# Patient Record
Sex: Male | Born: 1955 | Race: White | Hispanic: No | Smoking: Former smoker
Health system: Southern US, Community
[De-identification: ages and names within clinical notes are randomized; demographics above are authoritative.]

## PROBLEM LIST (undated history)

## (undated) DIAGNOSIS — R0789 Other chest pain: Secondary | ICD-10-CM

## (undated) DIAGNOSIS — C7931 Secondary malignant neoplasm of brain: Secondary | ICD-10-CM

## (undated) DIAGNOSIS — I10 Essential (primary) hypertension: Secondary | ICD-10-CM

## (undated) DIAGNOSIS — E559 Vitamin D deficiency, unspecified: Secondary | ICD-10-CM

## (undated) DIAGNOSIS — C3412 Malignant neoplasm of upper lobe, left bronchus or lung: Secondary | ICD-10-CM

## (undated) DIAGNOSIS — C7951 Secondary malignant neoplasm of bone: Secondary | ICD-10-CM

## (undated) DIAGNOSIS — R0602 Shortness of breath: Secondary | ICD-10-CM

## (undated) DIAGNOSIS — R5383 Other fatigue: Secondary | ICD-10-CM

## (undated) DIAGNOSIS — R9439 Abnormal result of other cardiovascular function study: Secondary | ICD-10-CM

## (undated) DIAGNOSIS — N400 Enlarged prostate without lower urinary tract symptoms: Secondary | ICD-10-CM

## (undated) DIAGNOSIS — E785 Hyperlipidemia, unspecified: Secondary | ICD-10-CM

## (undated) HISTORY — DX: Essential (primary) hypertension: I10

## (undated) HISTORY — DX: Hyperlipidemia, unspecified: E78.5

## (undated) HISTORY — DX: Secondary malignant neoplasm of bone: C79.51

## (undated) HISTORY — PX: KNEE SURGERY: SHX244

## (undated) HISTORY — DX: Secondary malignant neoplasm of brain: C79.31

## (undated) HISTORY — DX: Malignant neoplasm of upper lobe, left bronchus or lung: C34.12

## (undated) HISTORY — PX: HERNIA REPAIR: SHX51

## (undated) HISTORY — DX: Other chest pain: R07.89

## (undated) HISTORY — DX: Benign prostatic hyperplasia without lower urinary tract symptoms: N40.0

## (undated) HISTORY — DX: Vitamin D deficiency, unspecified: E55.9

## (undated) HISTORY — PX: TUMOR REMOVAL: SHX12

## (undated) HISTORY — DX: Other fatigue: R53.83

## (undated) HISTORY — DX: Abnormal result of other cardiovascular function study: R94.39

## (undated) HISTORY — DX: Shortness of breath: R06.02

---

## 1998-06-26 ENCOUNTER — Emergency Department (HOSPITAL_COMMUNITY): Admission: EM | Admit: 1998-06-26 | Discharge: 1998-06-26 | Payer: Self-pay | Admitting: *Deleted

## 1998-11-26 ENCOUNTER — Other Ambulatory Visit: Admission: RE | Admit: 1998-11-26 | Discharge: 1998-11-26 | Payer: Self-pay | Admitting: Gastroenterology

## 2016-01-10 ENCOUNTER — Other Ambulatory Visit: Payer: Self-pay

## 2016-12-25 DIAGNOSIS — E785 Hyperlipidemia, unspecified: Secondary | ICD-10-CM | POA: Diagnosis not present

## 2016-12-25 DIAGNOSIS — Z0000 Encounter for general adult medical examination without abnormal findings: Secondary | ICD-10-CM | POA: Diagnosis not present

## 2016-12-25 DIAGNOSIS — Z1389 Encounter for screening for other disorder: Secondary | ICD-10-CM | POA: Diagnosis not present

## 2016-12-25 DIAGNOSIS — R7309 Other abnormal glucose: Secondary | ICD-10-CM | POA: Diagnosis not present

## 2017-08-15 DIAGNOSIS — E785 Hyperlipidemia, unspecified: Secondary | ICD-10-CM | POA: Diagnosis not present

## 2017-08-15 DIAGNOSIS — E559 Vitamin D deficiency, unspecified: Secondary | ICD-10-CM | POA: Diagnosis not present

## 2017-08-15 DIAGNOSIS — R7309 Other abnormal glucose: Secondary | ICD-10-CM | POA: Diagnosis not present

## 2017-08-15 DIAGNOSIS — D649 Anemia, unspecified: Secondary | ICD-10-CM | POA: Diagnosis not present

## 2017-08-15 DIAGNOSIS — Z79899 Other long term (current) drug therapy: Secondary | ICD-10-CM | POA: Diagnosis not present

## 2017-08-15 DIAGNOSIS — R350 Frequency of micturition: Secondary | ICD-10-CM | POA: Diagnosis not present

## 2018-01-01 DIAGNOSIS — E559 Vitamin D deficiency, unspecified: Secondary | ICD-10-CM | POA: Diagnosis not present

## 2018-01-01 DIAGNOSIS — H6120 Impacted cerumen, unspecified ear: Secondary | ICD-10-CM | POA: Diagnosis not present

## 2018-01-01 DIAGNOSIS — R6889 Other general symptoms and signs: Secondary | ICD-10-CM | POA: Diagnosis not present

## 2018-01-01 DIAGNOSIS — E785 Hyperlipidemia, unspecified: Secondary | ICD-10-CM | POA: Diagnosis not present

## 2018-01-01 DIAGNOSIS — J029 Acute pharyngitis, unspecified: Secondary | ICD-10-CM | POA: Diagnosis not present

## 2018-01-01 DIAGNOSIS — Z0000 Encounter for general adult medical examination without abnormal findings: Secondary | ICD-10-CM | POA: Diagnosis not present

## 2018-08-21 DIAGNOSIS — R0789 Other chest pain: Secondary | ICD-10-CM | POA: Diagnosis not present

## 2018-08-21 DIAGNOSIS — D649 Anemia, unspecified: Secondary | ICD-10-CM | POA: Diagnosis not present

## 2018-08-21 DIAGNOSIS — E785 Hyperlipidemia, unspecified: Secondary | ICD-10-CM | POA: Diagnosis not present

## 2018-08-21 DIAGNOSIS — R7309 Other abnormal glucose: Secondary | ICD-10-CM | POA: Diagnosis not present

## 2018-08-22 DIAGNOSIS — R0789 Other chest pain: Secondary | ICD-10-CM | POA: Diagnosis not present

## 2018-09-09 DIAGNOSIS — R9439 Abnormal result of other cardiovascular function study: Secondary | ICD-10-CM

## 2018-09-09 DIAGNOSIS — R0789 Other chest pain: Secondary | ICD-10-CM

## 2018-09-09 DIAGNOSIS — I10 Essential (primary) hypertension: Secondary | ICD-10-CM

## 2018-09-09 DIAGNOSIS — E785 Hyperlipidemia, unspecified: Secondary | ICD-10-CM | POA: Insufficient documentation

## 2018-09-09 DIAGNOSIS — N400 Enlarged prostate without lower urinary tract symptoms: Secondary | ICD-10-CM

## 2018-09-09 DIAGNOSIS — R0602 Shortness of breath: Secondary | ICD-10-CM | POA: Insufficient documentation

## 2018-09-09 DIAGNOSIS — R5383 Other fatigue: Secondary | ICD-10-CM

## 2018-09-09 DIAGNOSIS — E559 Vitamin D deficiency, unspecified: Secondary | ICD-10-CM

## 2018-09-09 HISTORY — DX: Vitamin D deficiency, unspecified: E55.9

## 2018-09-09 HISTORY — DX: Other fatigue: R53.83

## 2018-09-09 HISTORY — DX: Other chest pain: R07.89

## 2018-09-09 HISTORY — DX: Benign prostatic hyperplasia without lower urinary tract symptoms: N40.0

## 2018-09-09 HISTORY — DX: Essential (primary) hypertension: I10

## 2018-09-09 HISTORY — DX: Hyperlipidemia, unspecified: E78.5

## 2018-09-09 HISTORY — DX: Shortness of breath: R06.02

## 2018-09-09 HISTORY — DX: Abnormal result of other cardiovascular function study: R94.39

## 2018-09-10 ENCOUNTER — Ambulatory Visit: Payer: Self-pay | Admitting: Cardiology

## 2018-09-25 NOTE — Progress Notes (Signed)
Cardiology Office Note:    Date:  09/26/2018   ID:  Andrew Bowen, DOB 10-Sep-1956, MRN [id number]  PCP:  Ocie Doyne., MD  Cardiologist:  Shirlee More, MD   Referring MD: No ref. provider found  ASSESSMENT:    1. Abnormal stress ECG with treadmill   2. Anginal equivalent ([place])   3. Type 2 diabetes mellitus without complication, without long-term current use of insulin ([place])   4. Pure hypercholesterolemia    PLAN:    In order of problems listed above:  1. He is in a high risk group with age type 2 diabetes hyperlipidemia as anginal equivalent symptom of exertional dyspnea and abnormal treadmill stress EKG test.  I reviewed with the patient that it does not give incremental prognostic information other than normal or abnormal in his case advised to undergo further evaluation after discussion of options of imaging modalities or cardiac CTA he elects to undergo cardiac CTA to be set up as outpatient and if he has severe flow-limiting stenosis would benefit from further evaluation and revascularization.  I will see him back in the office in 6 weeks to review the results 2. Typical symptoms limiting exertional dyspnea see above 3. Stable managed by his PCP A1c is above target if a second agent as needed GLP receptor antagonist or SGLT2 agents would be ideal 4. Continue with statin if he has CAD he will require more intensive therapy either adding Zetia or PCSK9  Next appointment 6 weeks   Medication Adjustments/Labs and Tests Ordered: Current medicines are reviewed at length with the patient today.  Concerns regarding medicines are outlined above.  Orders Placed This Encounter  Procedures  . CT CORONARY MORPH W/CTA COR W/SCORE W/CA W/CM &/OR WO/CM  . CT CORONARY FRACTIONAL FLOW RESERVE DATA PREP  . CT CORONARY FRACTIONAL FLOW RESERVE FLUID ANALYSIS  . EKG 12-Lead   No orders of the defined types were placed in this encounter.    Chief Complaint  Patient presents with  . Chest  Pain    abnormal stress test  . Diabetes Mellitus  . Hyperlipidemia  . Hypertension    History of Present Illness:    Andrew Bowen is a 62 y.o. male who is being seen today for the evaluation of exertional dyspnea and abnormal treadmill stress EKG test recently at [facility] at the request of Dr Micheal Likens.  In a years ago I cared for his ex-wife.  He always considers himself an active athletic man tells me he scored 4 touchdowns was quarterback and one football game in high school.  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  The symptoms have progressed they now occur frequently and they become limiting ACS to stop and rest after about 5 minutes to recover.  He is at increased cardiovascular risk with age male sex type 2 diabetes and hyperlipidemia.  He underwent a treadmill stress EKG at [facility] of 10/22/2017 which showed a well-preserved exercise tolerance but he had a markedly abnormal EKG response with 1 to 2 mm of ST depression in multiple leads.  He sees me today for evaluation regarding CAD he is not having chest pain palpitation or syncope.  We reviewed the options for further evaluation and decided to undergo cardiac CTA in [facility] to define the presence or absence of CAD in severity and if he has flow-limiting stenosis would benefit from further evaluation and percutaneous intervention.  He has no dye allergy normal renal function and have decided to hold on starting aspirin with his history of anemia and hemorrhoidal bleeding. Relates a history of heart murmur as a child that resolved in adult life no history of congenital or rheumatic heart disease  Past Medical History:  Diagnosis Date  . Abnormal stress test 09/09/2018  . Atypical chest pain 09/09/2018  . BPH (benign prostatic hyperplasia) 09/09/2018  . Fatigue 09/09/2018  . Hyperlipidemia 09/09/2018  . Hypertension 09/09/2018  . Shortness of  breath 09/09/2018  . Vitamin D deficiency 09/09/2018    Past Surgical History:  Procedure Laterality Date  . HERNIA REPAIR    . KNEE SURGERY    . TUMOR REMOVAL      Current Medications: Current Meds  Medication Sig  . ferrous sulfate 325 (65 FE) MG tablet Take 325 mg by mouth daily with breakfast.  . Melatonin 3-10 MG TABS Take 1 tablet by mouth daily.  . metFORMIN (GLUCOPHAGE) 500 MG tablet Take by mouth 2 (two) times daily with a meal.  . pravastatin (PRAVACHOL) 80 MG tablet Take 80 mg by mouth daily.  . tamsulosin (FLOMAX) 0.4 MG CAPS capsule Take 0.4 mg by mouth daily.     Allergies:   Aspirin   Social History   Socioeconomic History  . Marital status: Single    Spouse name: Not on file  . Number of children: Not on file  . Years of education: Not on file  . Highest education level: Not on file  Occupational History  . Not on file  Social Needs  . Financial resource strain: Not on file  . Food insecurity:    Worry: Not on file    Inability: Not on file  . Transportation needs:    Medical: Not on file    Non-medical: Not on file  Tobacco Use  . Smoking status: Unknown If Ever Smoked  . Smokeless tobacco: Current User    Types: Chew  Substance and Sexual Activity  . Alcohol use: Yes    Comment: occ  . Drug use: Not Currently  . Sexual activity: Not on file  Lifestyle  . Physical activity:    Days per week: Not on file    Minutes per session: Not on file  . Stress: Not on file  Relationships  . Social connections:    Talks on phone: Not on file    Gets together: Not on file    Attends religious service: Not on file    Active member of club or organization: Not on file    Attends meetings of clubs or organizations: Not on file    Relationship status: Not on file  Other Topics Concern  . Not on file  Social History Narrative  . Not on file     Family History: The patient's family history includes Anemia in his brother; Asthma in his father and  mother; Colon cancer in his brother; Heart disease in his mother.  ROS:   Review of Systems  HENT: Negative.   Eyes: Negative.   Cardiovascular: Positive for dyspnea on exertion.  Respiratory: Positive for shortness of breath and snoring.   Endocrine: Negative.   Hematologic/Lymphatic: Negative.   Skin: Negative.   Musculoskeletal: Negative.   Gastrointestinal: Negative.   Genitourinary: Negative.   Neurological: Negative.   Psychiatric/Behavioral: Negative.    Please see the history of present illness.     All other systems reviewed and are negative.  EKGs/Labs/Other Studies Reviewed:  The following studies were reviewed today:   EKG:  EKG is  ordered today.  The ekg ordered today demonstrates sinus rhythm normal  Recent Labs: Normal CMP hemoglobin 11.6 cholesterol 185 LDL 100 HDL 41 A1c 7.9 creatinine normal 1.0 TSH normal No results found for requested labs within last 8760 hours.  Recent Lipid Panel No results found for: CHOL, TRIG, HDL, CHOLHDL, VLDL, LDLCALC, LDLDIRECT  Physical Exam:    VS:  BP (!) 162/74 (BP Location: Right Arm, Patient Position: Sitting, Cuff Size: Normal)   Pulse 66   Ht 6' (1.829 m)   Wt 200 lb 12.8 oz (91.1 kg)   SpO2 98%   BMI 27.23 kg/m     Wt Readings from Last 3 Encounters:  09/26/18 200 lb 12.8 oz (91.1 kg)     GEN:  Well nourished, well developed in no acute distress HEENT: Normal NECK: No JVD; No carotid bruits LYMPHATICS: No lymphadenopathy CARDIAC: RRR, no murmurs, rubs, gallops RESPIRATORY:  Clear to auscultation without rales, wheezing or rhonchi  ABDOMEN: Soft, non-tender, non-distended MUSCULOSKELETAL:  No edema; No deformity  SKIN: Warm and dry NEUROLOGIC:  Alert and oriented x 3 PSYCHIATRIC:  Normal affect     Signed, Shirlee More, MD  09/26/2018 5:29 PM    [facility]

## 2018-09-26 ENCOUNTER — Ambulatory Visit (INDEPENDENT_AMBULATORY_CARE_PROVIDER_SITE_OTHER): Payer: Commercial Managed Care - PPO | Admitting: Cardiology

## 2018-09-26 ENCOUNTER — Encounter: Payer: Self-pay | Admitting: Cardiology

## 2018-09-26 VITALS — BP 162/74 | HR 66 | Ht 72.0 in | Wt 200.8 lb

## 2018-09-26 DIAGNOSIS — I208 Other forms of angina pectoris: Secondary | ICD-10-CM | POA: Diagnosis not present

## 2018-09-26 DIAGNOSIS — E7800 Pure hypercholesterolemia, unspecified: Secondary | ICD-10-CM | POA: Diagnosis not present

## 2018-09-26 DIAGNOSIS — E119 Type 2 diabetes mellitus without complications: Secondary | ICD-10-CM | POA: Diagnosis not present

## 2018-09-26 DIAGNOSIS — R9439 Abnormal result of other cardiovascular function study: Secondary | ICD-10-CM | POA: Diagnosis not present

## 2018-09-26 NOTE — Patient Instructions (Addendum)
Medication Instructions:  Your physician recommends that you continue on your current medications as directed. Please refer to the Current Medication list given to you today.  If you need a refill on your cardiac medications before your next appointment, please call your pharmacy.   Lab work: You will need lab work 1 week prior work to cardiac testing. BMP If you have labs (blood work) drawn today and your tests are completely normal, you will receive your results only by: Marland Kitchen MyChart Message (if you have MyChart) OR . A paper copy in the mail If you have any lab test that is abnormal or we need to change your treatment, we will call you to review the results.  Testing/Procedures:  Your physician has requested that you have cardiac CT. Cardiac computed tomography (CT) is a painless test that uses an x-ray machine to take clear, detailed pictures of your heart. For further information please visit [email or web address]. Please follow instruction sheet as given.   Please arrive at the [facility] main entrance of [facility] at xx:xx AM (30-45 minutes prior to test start time)  [facility] [place], [place] [address] [phone]  Proceed to the [facility] Radiology Department (First Floor).  Please follow these instructions carefully (unless otherwise directed):  Hold all erectile dysfunction medications at least 48 hours prior to test.  On the Night Before the Test: . Be sure to Drink plenty of water. . Do not consume any caffeinated/decaffeinated beverages or chocolate 12 hours prior to your test. . Do not take any antihistamines 12 hours prior to your test. . DO NOT TAKE Metformin do not take 24 hours prior to test.   On the Day of the Test: . Drink plenty of water. Do not drink any water within one hour of the test. . Do not eat any food 4 hours prior to the test. . You may take your regular medications prior to the test.  . Take metoprolol  (Lopressor) two hours prior to test.                 -If HR is less than 55 beats per minute- No Beta Blocker                -IF HR is greater than 55 beats per minute take metoprolol 100 mg.      After the Test: . Drink plenty of water. . After receiving IV contrast, you may experience a mild flushed feeling. This is normal. . On occasion, you may experience a mild rash up to 24 hours after the test. This is not dangerous. If this occurs, you can take Benadryl 25 mg and increase your fluid intake. . If you experience trouble breathing, this can be serious. If it is severe call 911 IMMEDIATELY. If it is mild, please call our office. . If you take any of these medications: Glipizide/Metformin, Avandament, Glucavance, please do not take 48 hours after completing test.   Follow-Up: At [facility], you and your health needs are our priority.  As part of our continuing mission to provide you with exceptional heart care, we have created designated Provider Care Teams.  These Care Teams include your primary Cardiologist (physician) and Advanced Practice Providers (APPs -  Physician Assistants and Nurse Practitioners) who all work together to provide you with the care you need, when you need it. You will need a follow up appointment in 6 weeks.  Please call our office 2 months  in advance to schedule this appointment.

## 2018-09-27 ENCOUNTER — Telehealth: Payer: Self-pay

## 2018-09-27 MED ORDER — METOPROLOL TARTRATE 50 MG PO TABS: 50.0000 mg | ORAL_TABLET | Freq: Once | ORAL | 0 refills | Status: DC

## 2018-09-27 NOTE — Addendum Note (Signed)
Addended by: Stevan Born on: 09/27/2018 11:06 AM   Modules accepted: Orders

## 2018-09-27 NOTE — Addendum Note (Signed)
Addended by: Stevan Born on: 09/27/2018 11:11 AM   Modules accepted: Orders

## 2018-09-27 NOTE — Telephone Encounter (Addendum)
Spoke with patient by phone explaining cardiac CTA procedure.  Patient advised to hold Metformin 24 hours prior to test, as well as 48 hours after test. Patient advised that he will be contacted with appointment date and time.  He will need BMP 7 days prior to procedure.  Patient advised to contact our office once procedure has been scheduled to set up follow up appointment with Dr Bettina Gavia.    Patient advised of metoprolol instructions prior to testing. Rx sent to pharmacy.    Patient agreed to plan and verbalized understanding.   Copy of instructions mailed to patient.

## 2018-10-30 ENCOUNTER — Telehealth: Payer: Self-pay

## 2018-10-30 DIAGNOSIS — E119 Type 2 diabetes mellitus without complications: Secondary | ICD-10-CM

## 2018-10-30 DIAGNOSIS — Z01810 Encounter for preprocedural cardiovascular examination: Secondary | ICD-10-CM

## 2018-10-30 NOTE — Telephone Encounter (Signed)
Patient notified that cardiac CTA has been scheduled for 11-18-2018.  Patient advised that he will need BMP prior to procedure.   Patient advised to report to the office on the week of 11-11-2018 for labs.  Patient agreed to plan and verbalized understanding.

## 2018-11-11 DIAGNOSIS — I208 Other forms of angina pectoris: Secondary | ICD-10-CM | POA: Diagnosis not present

## 2018-11-11 DIAGNOSIS — R9439 Abnormal result of other cardiovascular function study: Secondary | ICD-10-CM | POA: Diagnosis not present

## 2018-11-11 DIAGNOSIS — E119 Type 2 diabetes mellitus without complications: Secondary | ICD-10-CM | POA: Diagnosis not present

## 2018-11-11 LAB — BASIC METABOLIC PANEL
BUN/Creatinine Ratio: 13 (ref 10–24)
BUN: 12 mg/dL (ref 8–27)
CALCIUM: 9.5 mg/dL (ref 8.6–10.2)
CO2: 21 mmol/L (ref 20–29)
Chloride: 99 mmol/L (ref 96–106)
Creatinine, Ser: 0.96 mg/dL (ref 0.76–1.27)
GFR calc Af Amer: 99 mL/min/{1.73_m2} (ref >59)
GFR calc non Af Amer: 86 mL/min/{1.73_m2} (ref >59)
Glucose: 163 mg/dL — ABNORMAL HIGH (ref 65–99)
Potassium: 4 mmol/L (ref 3.5–5.2)
Sodium: 137 mmol/L (ref 134–144)

## 2018-11-15 ENCOUNTER — Telehealth (HOSPITAL_COMMUNITY): Payer: Self-pay | Admitting: Emergency Medicine

## 2018-11-15 NOTE — Telephone Encounter (Signed)
Reaching out to patient to offer assistance regarding upcoming cardiac imaging study; pt verbalizes understanding of appt date/time, parking situation and where to check in, pre-test NPO status and medications ordered, and verified current allergies; name and call back number provided for further questions should they arise Andrew Bond RN Navigator Cardiac Imaging [place] [facility] [phone] office [phone] cell  Pt concerned about taking metoprolol when HR is already low. Will have his nurse daughter check HR before and if >70bpm will take the metoprolol, if <70 bpm he will skip it. Instructed to skip metformin day of test also

## 2018-11-18 ENCOUNTER — Encounter (HOSPITAL_COMMUNITY): Payer: Self-pay

## 2018-11-18 ENCOUNTER — Ambulatory Visit (HOSPITAL_COMMUNITY)
Admission: RE | Admit: 2018-11-18 | Discharge: 2018-11-18 | Disposition: A | Discharge status: Home / Self Care | Payer: Commercial Managed Care - PPO | Source: Ambulatory Visit | Attending: Cardiology | Admitting: Cardiology

## 2018-11-18 ENCOUNTER — Ambulatory Visit (HOSPITAL_COMMUNITY): Admission: RE | Admit: 2018-11-18 | Payer: Commercial Managed Care - PPO | Source: Ambulatory Visit

## 2018-11-18 DIAGNOSIS — Z006 Encounter for examination for normal comparison and control in clinical research program: Secondary | ICD-10-CM

## 2018-11-18 DIAGNOSIS — R9439 Abnormal result of other cardiovascular function study: Secondary | ICD-10-CM | POA: Diagnosis present

## 2018-11-18 DIAGNOSIS — R9430 Abnormal result of cardiovascular function study, unspecified: Secondary | ICD-10-CM

## 2018-11-18 MED ORDER — NITROGLYCERIN 0.4 MG SL SUBL
0.8000 mg | SUBLINGUAL_TABLET | Freq: Once | SUBLINGUAL | Status: AC
  Administered 2018-11-18: 0.8 mg via SUBLINGUAL
  Filled 2018-11-18: qty 25

## 2018-11-18 MED ORDER — NITROGLYCERIN 0.4 MG SL SUBL
SUBLINGUAL_TABLET | SUBLINGUAL | Status: AC
  Administered 2018-11-18: 0.8 mg via SUBLINGUAL
  Filled 2018-11-18: qty 2

## 2018-11-18 MED ORDER — IOPAMIDOL (ISOVUE-370) INJECTION 76%
80.0000 mL | Freq: Once | INTRAVENOUS | Status: AC | PRN
  Administered 2018-11-18: 80 mL via INTRAVENOUS

## 2018-11-18 NOTE — Research (Signed)
Cadfem Informed Consent    Patient Name: Andrew Bowen   Subject met inclusion and exclusion criteria.  The informed consent form, study requirements and expectations were reviewed with the subject and questions and concerns were addressed prior to the signing of the consent form.  The subject verbalized understanding of the trail requirements.  The subject agreed to participate in the CADFEM trial and signed the informed consent.  The informed consent was obtained prior to performance of any protocol-specific procedures for the subject.  A copy of the signed informed consent was given to the subject and a copy was placed in the subject's medical record.   [name]

## 2018-12-15 NOTE — Progress Notes (Signed)
Cardiology Office Note:    Date:  12/16/2018   ID:  Andrew Bowen, DOB [birth year], MRN [id number]  PCP:  Ocie Doyne., MD  Cardiologist:  Shirlee More, MD    Referring MD: Ocie Doyne., MD    ASSESSMENT:    1. Atypical chest pain   2. Pure hypercholesterolemia    PLAN:    In order of problems listed above:  1. Reassuring cardiac CTA is clinical problem is exertional shortness of breath at this time I would not do any further cardiac diagnostic testing encouraged him to continue lipid-lowering treatment.  If shortness of breath worsens evaluation such as PFTs would be appropriate 2. Stable continue statin   Next appointment: As needed   Medication Adjustments/Labs and Tests Ordered: Current medicines are reviewed at length with the patient today.  Concerns regarding medicines are outlined above.  No orders of the defined types were placed in this encounter.  No orders of the defined types were placed in this encounter.   Chief Complaint  Patient presents with  . Follow-up    after CCTA    History of Present Illness:    Andrew Bowen is a 63 y.o. male with a hx of exertional dyspnea and abnormal treadmill stress EKG test recently at [facility]   last seen 09/27/19. Compliance with diet, lifestyle and medications: Yes  He is reassured by his cardiac CTA and will continue lipid-lowering therapy.  His concerns are short of breath when he does heavy vigorous mechanical work lifting carrying and a hot temperature.  He has a background history of smoking 15 years 2 packs/day I suspect his exertional shortness of breath is more pulmonary and not ischemic in nature and his chest CT did not show any obvious lung disease.  He is having no angina.  He is aspirin intolerant more bleeding in the past  Cardiac CTA 11/18/18:  IMPRESSION: 1. Calcium score 30 which is 50 th percentile for age and sex isolated to [place] noted in proximal and mid RCA only 2.  No obstructive  CAD see description above RCA: Less than 30% calcific plaque in proximal and mid RCA 3.  Normal aortic root 3.2 cm  Past Medical History:  Diagnosis Date  . Abnormal stress test 09/09/2018  . Atypical chest pain 09/09/2018  . BPH (benign prostatic hyperplasia) 09/09/2018  . Fatigue 09/09/2018  . Hyperlipidemia 09/09/2018  . Hypertension 09/09/2018  . Shortness of breath 09/09/2018  . Vitamin D deficiency 09/09/2018    Past Surgical History:  Procedure Laterality Date  . HERNIA REPAIR    . KNEE SURGERY    . TUMOR REMOVAL      Current Medications: Current Meds  Medication Sig  . ferrous sulfate 325 (65 FE) MG tablet Take 325 mg by mouth daily with breakfast.  . Melatonin 3-10 MG TABS Take 1 tablet by mouth daily as needed.   . metFORMIN (GLUCOPHAGE) 500 MG tablet Take by mouth 2 (two) times daily with a meal.  . pravastatin (PRAVACHOL) 80 MG tablet Take 80 mg by mouth daily.  . tamsulosin (FLOMAX) 0.4 MG CAPS capsule Take 0.4 mg by mouth daily.     Allergies:   Aspirin   Social History   Socioeconomic History  . Marital status: Divorced    Spouse name: Not on file  . Number of children: Not on file  . Years of education: Not on file  . Highest education level: Not on file  Occupational History  .  Not on file  Social Needs  . Financial resource strain: Not on file  . Food insecurity:    Worry: Not on file    Inability: Not on file  . Transportation needs:    Medical: Not on file    Non-medical: Not on file  Tobacco Use  . Smoking status: Former Smoker    Packs/day: 2.00    Years: 10.00    Pack years: 20.00    Types: Cigarettes    Last attempt to quit: 1990    Years since quitting: 30.2  . Smokeless tobacco: Current User    Types: Chew  Substance and Sexual Activity  . Alcohol use: Yes    Comment: occ  . Drug use: Not Currently  . Sexual activity: Not on file  Lifestyle  . Physical activity:    Days per week: Not on file    Minutes per session: Not on file    . Stress: Not on file  Relationships  . Social connections:    Talks on phone: Not on file    Gets together: Not on file    Attends religious service: Not on file    Active member of club or organization: Not on file    Attends meetings of clubs or organizations: Not on file    Relationship status: Not on file  Other Topics Concern  . Not on file  Social History Narrative  . Not on file     Family History: The patient's family history includes Anemia in his brother; Asthma in his father and mother; Colon cancer in his brother; Heart disease in his mother. ROS:   Please see the history of present illness.    All other systems reviewed and are negative.  EKGs/Labs/Other Studies Reviewed:    The following studies were reviewed today:   Recent Labs: 11/11/2018: BUN 12; Creatinine, Ser 0.96; Potassium 4.0; Sodium 137  Recent Lipid Panel No results found for: CHOL, TRIG, HDL, CHOLHDL, VLDL, LDLCALC, LDLDIRECT  Physical Exam:    VS:  BP 138/72 (BP Location: Right Arm, Patient Position: Sitting, Cuff Size: Normal)   Pulse (!) 54   Ht 6' (1.829 m)   Wt 196 lb (88.9 kg)   SpO2 97%   BMI 26.58 kg/m     Wt Readings from Last 3 Encounters:  12/16/18 196 lb (88.9 kg)  09/26/18 200 lb 12.8 oz (91.1 kg)     GEN:  Well nourished, well developed in no acute distress HEENT: Normal NECK: No JVD; No carotid bruits LYMPHATICS: No lymphadenopathy CARDIAC: RRR, no murmurs, rubs, gallops RESPIRATORY:  Clear to auscultation without rales, wheezing or rhonchi  ABDOMEN: Soft, non-tender, non-distended MUSCULOSKELETAL:  No edema; No deformity  SKIN: Warm and dry NEUROLOGIC:  Alert and oriented x 3 PSYCHIATRIC:  Normal affect    Signed, Shirlee More, MD  12/16/2018 9:41 AM    [facility]

## 2018-12-16 ENCOUNTER — Encounter: Payer: Self-pay | Admitting: Cardiology

## 2018-12-16 ENCOUNTER — Ambulatory Visit (INDEPENDENT_AMBULATORY_CARE_PROVIDER_SITE_OTHER): Payer: Commercial Managed Care - PPO | Admitting: Cardiology

## 2018-12-16 VITALS — BP 138/72 | HR 54 | Ht 72.0 in | Wt 196.0 lb

## 2018-12-16 DIAGNOSIS — E7800 Pure hypercholesterolemia, unspecified: Secondary | ICD-10-CM

## 2018-12-16 DIAGNOSIS — R0789 Other chest pain: Secondary | ICD-10-CM

## 2018-12-16 NOTE — Patient Instructions (Signed)
Medication Instructions:  Your physician recommends that you continue on your current medications as directed. Please refer to the Current Medication list given to you today.  If you need a refill on your cardiac medications before your next appointment, please call your pharmacy.   Lab work: NONE If you have labs (blood work) drawn today and your tests are completely normal, you will receive your results only by: Marland Kitchen MyChart Message (if you have MyChart) OR . A paper copy in the mail If you have any lab test that is abnormal or we need to change your treatment, we will call you to review the results.  Testing/Procedures: NONE  Follow-Up: At [facility], you and your health needs are our priority.  As part of our continuing mission to provide you with exceptional heart care, we have created designated Provider Care Teams.  These Care Teams include your primary Cardiologist (physician) and Advanced Practice Providers (APPs -  Physician Assistants and Nurse Practitioners) who all work together to provide you with the care you need, when you need it. You will need a follow up appointment as needed or if symptoms worsen or fail to improve.

## 2019-04-04 IMAGING — CT CT HEART MORP W/ CTA COR W/ SCORE W/ CA W/CM &/OR W/O CM
3 of 8 series · 6 of 20 positions shown, 7 images · non-contrast
Comparison: None.

Addendum:
CLINICAL DATA: Chest pain

EXAM:
Cardiac CTA
MEDICATIONS:
Sub lingual nitro. 4mg and lopressor 5mg
TECHNIQUE: The patient was scanned on a Siemens Force [REDACTED]ice scanner. Gantry
rotation speed was 250 msecs. Collimation was .6 mm. A 100 kV
prospective scan was triggered in the ascending thoracic aorta at
140 HU's Full mA was used between 35% and 75% of the R-R interval.
Average HR during the scan was 48 bpm. The 3D data set was
interpreted on a dedicated work station using MPR, MIP and VRT
modes. A total of 80 cc of contrast was used.

[Series 8: best syst 33 % · axial · 0.36mm/px · z∈[-188,-136]mm · 2 of 388 slices shown, 3 images]
[im 130/388  vessel]
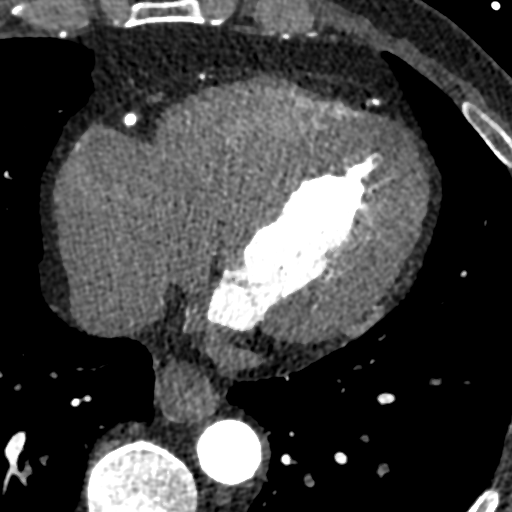
[im 130/388  lung]
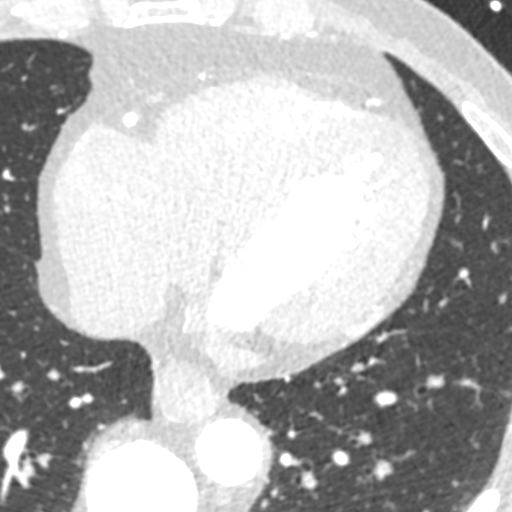
[im 259/388  vessel]
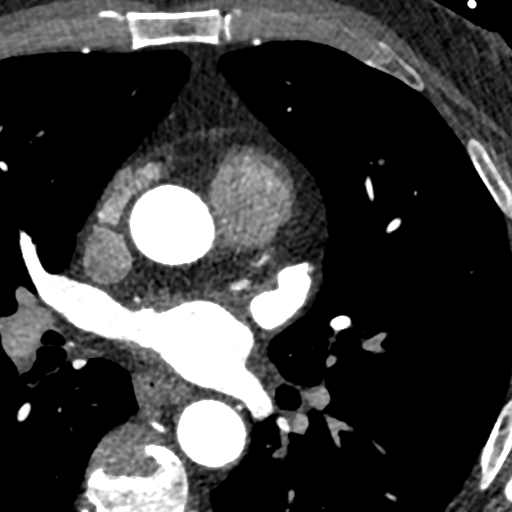

[Series 9: ts diast sharp 73 % · axial · 0.36mm/px · z∈[-188,-136]mm · 2 of 388 slices shown]
[im 130/388  lung]
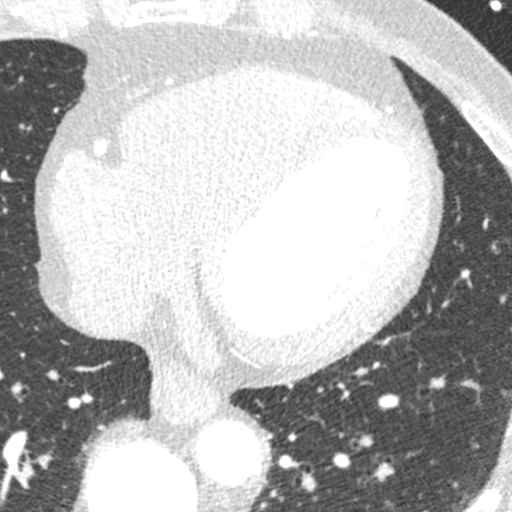
[im 259/388  lung]
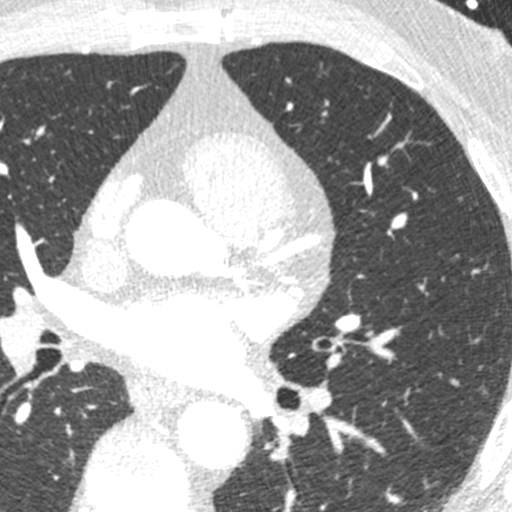

[Series 10: ts syst sharp 33 % · axial · 0.36mm/px · z∈[-188,-136]mm · 2 of 388 slices shown]
[im 130/388  lung]
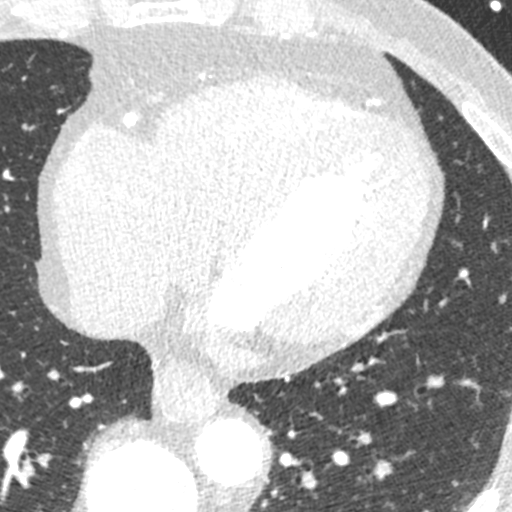
[im 259/388  lung]
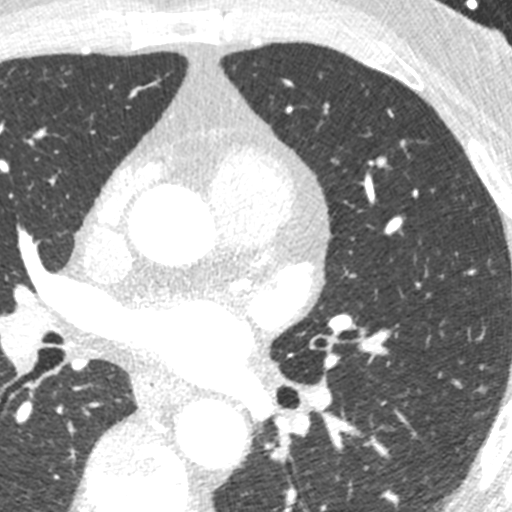

[6 of 20 positions shown; findings below may reference images not displayed]

FINDINGS: Non-cardiac: See separate report from [REDACTED]. No
significant findings on limited lung and soft tissue windows.

Calcium Score: Calcium noted in proximal and mid RCA only

Coronary Arteries: Right dominant with no anomalies

LM: Normal

LAD: Normal

OM Normal

D1: Very large artery normal

D2: Normal

Circumflex: Normal

OM1: Small normal

OM2: Normal

RCA: Less than 30% calcific plaque in proximal and mid RCA

PDA: Normal

PLA: Normal
IMPRESSION: 1. Calcium score 30 which is 50 th percentile for age and sex
isolated to RCA

2.  No obstructive CAD see description above

3.  Normal aortic root 3.2 cm

Manuella Tiger

EXAM:
OVER-READ INTERPRETATION  CT CHEST

The following report is an over-read performed by radiologist Dr.
over-read does not include interpretation of cardiac or coronary
anatomy or pathology. The cardiac CTA and coronary calcium score
interpretation by the cardiologist is attached.
FINDINGS: Aortic atherosclerosis. Within the visualized portions of the thorax
there are no suspicious appearing pulmonary nodules or masses, there
is no acute consolidative airspace disease, no pleural effusions, no
pneumothorax and no lymphadenopathy. Visualized portions of the
upper abdomen are unremarkable. There are no aggressive appearing
lytic or blastic lesions noted in the visualized portions of the
skeleton.
IMPRESSION: 1.  Aortic Atherosclerosis ([id number]).

*** End of Addendum ***

## 2019-05-02 ENCOUNTER — Encounter: Payer: Self-pay | Admitting: Gastroenterology

## 2020-06-03 DIAGNOSIS — C3412 Malignant neoplasm of upper lobe, left bronchus or lung: Secondary | ICD-10-CM | POA: Diagnosis not present

## 2020-06-18 LAB — CREATININE, SERUM: Creatinine: 0.8 (ref <=1.3)

## 2020-07-11 ENCOUNTER — Encounter: Payer: Self-pay | Admitting: Pharmacist

## 2020-07-11 DIAGNOSIS — C3412 Malignant neoplasm of upper lobe, left bronchus or lung: Secondary | ICD-10-CM

## 2020-07-12 DIAGNOSIS — C7951 Secondary malignant neoplasm of bone: Secondary | ICD-10-CM | POA: Diagnosis not present

## 2020-07-12 DIAGNOSIS — C7931 Secondary malignant neoplasm of brain: Secondary | ICD-10-CM | POA: Diagnosis not present

## 2020-07-12 DIAGNOSIS — C3412 Malignant neoplasm of upper lobe, left bronchus or lung: Secondary | ICD-10-CM | POA: Diagnosis not present

## 2020-07-20 ENCOUNTER — Inpatient Hospital Stay: Payer: Commercial Managed Care - PPO | Attending: Oncology

## 2020-07-20 DIAGNOSIS — Z5112 Encounter for antineoplastic immunotherapy: Secondary | ICD-10-CM | POA: Insufficient documentation

## 2020-07-20 DIAGNOSIS — C7931 Secondary malignant neoplasm of brain: Secondary | ICD-10-CM | POA: Insufficient documentation

## 2020-07-20 DIAGNOSIS — C3490 Malignant neoplasm of unspecified part of unspecified bronchus or lung: Secondary | ICD-10-CM | POA: Insufficient documentation

## 2020-07-20 DIAGNOSIS — C787 Secondary malignant neoplasm of liver and intrahepatic bile duct: Secondary | ICD-10-CM | POA: Insufficient documentation

## 2020-07-20 DIAGNOSIS — Z5189 Encounter for other specified aftercare: Secondary | ICD-10-CM | POA: Insufficient documentation

## 2020-07-20 DIAGNOSIS — Z5111 Encounter for antineoplastic chemotherapy: Secondary | ICD-10-CM | POA: Insufficient documentation

## 2020-07-21 ENCOUNTER — Inpatient Hospital Stay: Payer: Commercial Managed Care - PPO

## 2020-07-21 DIAGNOSIS — C7931 Secondary malignant neoplasm of brain: Secondary | ICD-10-CM | POA: Diagnosis not present

## 2020-07-21 DIAGNOSIS — C3412 Malignant neoplasm of upper lobe, left bronchus or lung: Secondary | ICD-10-CM | POA: Diagnosis not present

## 2020-07-21 DIAGNOSIS — C7951 Secondary malignant neoplasm of bone: Secondary | ICD-10-CM | POA: Diagnosis not present

## 2020-07-22 ENCOUNTER — Inpatient Hospital Stay: Payer: Commercial Managed Care - PPO

## 2020-07-27 ENCOUNTER — Other Ambulatory Visit: Payer: Self-pay | Admitting: Hematology and Oncology

## 2020-07-27 LAB — HEPATIC FUNCTION PANEL
ALT: 66 — AB (ref 10–40)
AST: 69 — AB (ref 14–40)
Alkaline Phosphatase: 211 — AB (ref 25–125)
Bilirubin, Total: 0.7

## 2020-07-27 LAB — BASIC METABOLIC PANEL
BUN: 17 (ref 4–21)
CO2: 30 — AB (ref 13–22)
Chloride: 94 — AB (ref 99–108)
Creatinine: 0.8 (ref 0.6–1.3)
Glucose: 122
Potassium: 4.5 (ref 3.4–5.3)
Sodium: 133 — AB (ref 137–147)

## 2020-07-27 LAB — COMPREHENSIVE METABOLIC PANEL
Albumin: 4.1 (ref 3.5–5.0)
Calcium: 9.5 (ref 8.7–10.7)

## 2020-07-27 LAB — CBC AND DIFFERENTIAL
HCT: 42 (ref 41–53)
Hemoglobin: 13.9 (ref 13.5–17.5)
Neutrophils Absolute: 8.69
Platelets: 245 (ref 150–399)
WBC: 10.6

## 2020-07-27 LAB — CBC: RBC: 4.8 (ref 3.87–5.11)

## 2020-07-28 ENCOUNTER — Telehealth: Payer: Self-pay | Admitting: Oncology

## 2020-07-28 ENCOUNTER — Inpatient Hospital Stay: Payer: Commercial Managed Care - PPO

## 2020-07-28 ENCOUNTER — Other Ambulatory Visit: Payer: Self-pay

## 2020-07-28 VITALS — BP 115/65 | HR 66 | Temp 98.3°F | Resp 18 | Ht 70.5 in | Wt 167.1 lb

## 2020-07-28 DIAGNOSIS — C3412 Malignant neoplasm of upper lobe, left bronchus or lung: Secondary | ICD-10-CM

## 2020-07-28 DIAGNOSIS — Z5111 Encounter for antineoplastic chemotherapy: Secondary | ICD-10-CM | POA: Diagnosis not present

## 2020-07-28 DIAGNOSIS — C787 Secondary malignant neoplasm of liver and intrahepatic bile duct: Secondary | ICD-10-CM | POA: Diagnosis not present

## 2020-07-28 DIAGNOSIS — C3490 Malignant neoplasm of unspecified part of unspecified bronchus or lung: Secondary | ICD-10-CM | POA: Diagnosis present

## 2020-07-28 DIAGNOSIS — Z5112 Encounter for antineoplastic immunotherapy: Secondary | ICD-10-CM | POA: Diagnosis present

## 2020-07-28 DIAGNOSIS — Z5189 Encounter for other specified aftercare: Secondary | ICD-10-CM | POA: Diagnosis not present

## 2020-07-28 DIAGNOSIS — C7931 Secondary malignant neoplasm of brain: Secondary | ICD-10-CM | POA: Diagnosis not present

## 2020-07-28 MED ORDER — SODIUM CHLORIDE 0.9 % IV SOLN
10.0000 mg | Freq: Once | INTRAVENOUS | Status: AC
  Administered 2020-07-28: 10 mg via INTRAVENOUS
  Filled 2020-07-28: qty 10
  Filled 2020-07-28: qty 1

## 2020-07-28 MED ORDER — PALONOSETRON HCL INJECTION 0.25 MG/5ML
0.2500 mg | Freq: Once | INTRAVENOUS | Status: AC
  Administered 2020-07-28: 0.25 mg via INTRAVENOUS

## 2020-07-28 MED ORDER — HEPARIN SOD (PORK) LOCK FLUSH 100 UNIT/ML IV SOLN
500.0000 [IU] | Freq: Once | INTRAVENOUS | Status: AC | PRN
  Administered 2020-07-28: 500 [IU]
  Filled 2020-07-28: qty 5

## 2020-07-28 MED ORDER — SODIUM CHLORIDE 0.9 % IV SOLN
150.0000 mg | Freq: Once | INTRAVENOUS | Status: AC
  Administered 2020-07-28: 150 mg via INTRAVENOUS
  Filled 2020-07-28: qty 150
  Filled 2020-07-28: qty 5

## 2020-07-28 MED ORDER — SODIUM CHLORIDE 0.9 % IV SOLN
Freq: Once | INTRAVENOUS | Status: AC
  Filled 2020-07-28: qty 250

## 2020-07-28 MED ORDER — PALONOSETRON HCL INJECTION 0.25 MG/5ML
INTRAVENOUS | Status: AC
  Filled 2020-07-28: qty 5

## 2020-07-28 MED ORDER — SODIUM CHLORIDE 0.9% FLUSH
10.0000 mL | INTRAVENOUS | Status: DC | PRN
  Administered 2020-07-28: 10 mL
  Filled 2020-07-28: qty 10

## 2020-07-28 MED ORDER — SODIUM CHLORIDE 0.9 % IV SOLN
100.0000 mg/m2 | Freq: Once | INTRAVENOUS | Status: AC
  Administered 2020-07-28: 190 mg via INTRAVENOUS
  Filled 2020-07-28 (×2): qty 9.5

## 2020-07-28 MED ORDER — SODIUM CHLORIDE 0.9 % IV SOLN
643.0000 mg | Freq: Once | INTRAVENOUS | Status: AC
  Administered 2020-07-28: 640 mg via INTRAVENOUS
  Filled 2020-07-28 (×2): qty 64

## 2020-07-28 MED ORDER — SODIUM CHLORIDE 0.9 % IV SOLN
1200.0000 mg | Freq: Once | INTRAVENOUS | Status: AC
  Administered 2020-07-28: 1200 mg via INTRAVENOUS
  Filled 2020-07-28 (×2): qty 20

## 2020-07-28 NOTE — Patient Instructions (Signed)
[facility] Discharge Instructions for Patients Receiving Chemotherapy  Today you received the following chemotherapy agents ATEZOLIZUMAB, ETOPOSIDE, CARBOPLATIN  To help prevent nausea and vomiting after your treatment, we encourage you to take your nausea medication.   If you develop nausea and vomiting that is not controlled by your nausea medication, call the clinic.   BELOW ARE SYMPTOMS THAT SHOULD BE REPORTED IMMEDIATELY:  *FEVER GREATER THAN 100.5 F  *CHILLS WITH OR WITHOUT FEVER  NAUSEA AND VOMITING THAT IS NOT CONTROLLED WITH YOUR NAUSEA MEDICATION  *UNUSUAL SHORTNESS OF BREATH  *UNUSUAL BRUISING OR BLEEDING  TENDERNESS IN MOUTH AND THROAT WITH OR WITHOUT PRESENCE OF ULCERS  *URINARY PROBLEMS  *BOWEL PROBLEMS  UNUSUAL RASH Items with * indicate a potential emergency and should be followed up as soon as possible.  Feel free to call the clinic should you have any questions or concerns at The clinic phone number is [phone].  Please show the [facility] [facility] at check-in to the Emergency Department and triage nurse.

## 2020-07-28 NOTE — Progress Notes (Signed)
PT IS STABLE AT DISCHARGE.

## 2020-07-28 NOTE — Telephone Encounter (Signed)
Scheduled appts per treatment plan and conversions from [facility]. Gave pt a print out of appt calendar.

## 2020-07-29 ENCOUNTER — Inpatient Hospital Stay: Payer: Commercial Managed Care - PPO

## 2020-07-29 VITALS — BP 124/70 | HR 61 | Temp 97.5°F | Resp 20 | Ht 70.5 in | Wt 168.1 lb

## 2020-07-29 DIAGNOSIS — Z5112 Encounter for antineoplastic immunotherapy: Secondary | ICD-10-CM | POA: Diagnosis not present

## 2020-07-29 DIAGNOSIS — C3412 Malignant neoplasm of upper lobe, left bronchus or lung: Secondary | ICD-10-CM

## 2020-07-29 MED ORDER — SODIUM CHLORIDE 0.9 % IV SOLN
10.0000 mg | Freq: Once | INTRAVENOUS | Status: AC
  Administered 2020-07-29: 10 mg via INTRAVENOUS
  Filled 2020-07-29: qty 1
  Filled 2020-07-29: qty 10

## 2020-07-29 MED ORDER — HEPARIN SOD (PORK) LOCK FLUSH 100 UNIT/ML IV SOLN
500.0000 [IU] | Freq: Once | INTRAVENOUS | Status: AC | PRN
  Administered 2020-07-29: 500 [IU]
  Filled 2020-07-29: qty 5

## 2020-07-29 MED ORDER — SODIUM CHLORIDE 0.9% FLUSH
10.0000 mL | INTRAVENOUS | Status: DC | PRN
  Administered 2020-07-29 (×2): 10 mL
  Filled 2020-07-29: qty 10

## 2020-07-29 MED ORDER — SODIUM CHLORIDE 0.9 % IV SOLN
Freq: Once | INTRAVENOUS | Status: AC
  Filled 2020-07-29: qty 250

## 2020-07-29 MED ORDER — SODIUM CHLORIDE 0.9 % IV SOLN
100.0000 mg/m2 | Freq: Once | INTRAVENOUS | Status: AC
  Administered 2020-07-29: 190 mg via INTRAVENOUS
  Filled 2020-07-29 (×2): qty 9.5

## 2020-07-29 NOTE — Progress Notes (Signed)
PT IS STABLE AT DISCHARGE.

## 2020-07-29 NOTE — Patient Instructions (Signed)
[facility] Discharge Instructions for Patients Receiving Chemotherapy  Today you received the following chemotherapy agents ETOPOSIDE  To help prevent nausea and vomiting after your treatment, we encourage you to take your nausea medication.   If you develop nausea and vomiting that is not controlled by your nausea medication, call the clinic.   BELOW ARE SYMPTOMS THAT SHOULD BE REPORTED IMMEDIATELY:  *FEVER GREATER THAN 100.5 F  *CHILLS WITH OR WITHOUT FEVER  NAUSEA AND VOMITING THAT IS NOT CONTROLLED WITH YOUR NAUSEA MEDICATION  *UNUSUAL SHORTNESS OF BREATH  *UNUSUAL BRUISING OR BLEEDING  TENDERNESS IN MOUTH AND THROAT WITH OR WITHOUT PRESENCE OF ULCERS  *URINARY PROBLEMS  *BOWEL PROBLEMS  UNUSUAL RASH Items with * indicate a potential emergency and should be followed up as soon as possible.  Feel free to call the clinic should you have any questions or concerns at The clinic phone number is [phone].  Please show the [facility] at check-in to the Emergency Department and triage nurse.

## 2020-07-30 ENCOUNTER — Other Ambulatory Visit: Payer: Self-pay

## 2020-07-30 ENCOUNTER — Inpatient Hospital Stay: Payer: Commercial Managed Care - PPO

## 2020-07-30 VITALS — BP 133/72 | HR 65 | Temp 98.2°F | Resp 18 | Ht 70.5 in | Wt 167.1 lb

## 2020-07-30 DIAGNOSIS — Z5112 Encounter for antineoplastic immunotherapy: Secondary | ICD-10-CM | POA: Diagnosis not present

## 2020-07-30 DIAGNOSIS — C3412 Malignant neoplasm of upper lobe, left bronchus or lung: Secondary | ICD-10-CM

## 2020-07-30 MED ORDER — HEPARIN SOD (PORK) LOCK FLUSH 100 UNIT/ML IV SOLN
500.0000 [IU] | Freq: Once | INTRAVENOUS | Status: AC | PRN
  Administered 2020-07-30: 500 [IU]
  Filled 2020-07-30: qty 5

## 2020-07-30 MED ORDER — PEGFILGRASTIM 6 MG/0.6ML ~~LOC~~ PSKT
PREFILLED_SYRINGE | SUBCUTANEOUS | Status: AC
  Filled 2020-07-30: qty 0.6

## 2020-07-30 MED ORDER — PEGFILGRASTIM 6 MG/0.6ML ~~LOC~~ PSKT
6.0000 mg | PREFILLED_SYRINGE | Freq: Once | SUBCUTANEOUS | Status: AC
  Administered 2020-07-30: 6 mg via SUBCUTANEOUS
  Filled 2020-07-30: qty 0.6

## 2020-07-30 MED ORDER — SODIUM CHLORIDE 0.9 % IV SOLN
Freq: Once | INTRAVENOUS | Status: AC
  Filled 2020-07-30: qty 250

## 2020-07-30 MED ORDER — SODIUM CHLORIDE 0.9% FLUSH
10.0000 mL | INTRAVENOUS | Status: DC | PRN
  Administered 2020-07-30: 10 mL
  Filled 2020-07-30: qty 10

## 2020-07-30 MED ORDER — SODIUM CHLORIDE 0.9 % IV SOLN
10.0000 mg | Freq: Once | INTRAVENOUS | Status: AC
  Administered 2020-07-30: 10 mg via INTRAVENOUS
  Filled 2020-07-30: qty 10

## 2020-07-30 MED ORDER — SODIUM CHLORIDE 0.9 % IV SOLN
100.0000 mg/m2 | Freq: Once | INTRAVENOUS | Status: AC
  Administered 2020-07-30: 190 mg via INTRAVENOUS
  Filled 2020-07-30: qty 9.5

## 2020-07-30 NOTE — Progress Notes (Signed)
PT IS STABLE AT DISCHARGE.

## 2020-07-30 NOTE — Patient Instructions (Signed)
[facility] Discharge Instructions for Patients Receiving Chemotherapy  Today you received the following chemotherapy agents *etoposide,pehfilgrastin on pro  To help prevent nausea and vomiting after your treatment, we encourage you to take your nausea medication.   If you develop nausea and vomiting that is not controlled by your nausea medication, call the clinic.   BELOW ARE SYMPTOMS THAT SHOULD BE REPORTED IMMEDIATELY:  *FEVER GREATER THAN 100.5 F  *CHILLS WITH OR WITHOUT FEVER  NAUSEA AND VOMITING THAT IS NOT CONTROLLED WITH YOUR NAUSEA MEDICATION  *UNUSUAL SHORTNESS OF BREATH  *UNUSUAL BRUISING OR BLEEDING  TENDERNESS IN MOUTH AND THROAT WITH OR WITHOUT PRESENCE OF ULCERS  *URINARY PROBLEMS  *BOWEL PROBLEMS  UNUSUAL RASH Items with * indicate a potential emergency and should be followed up as soon as possible.  Feel free to call the clinic should you have any questions or concerns at The clinic phone number is [phone].  Please show the [facility] at check-in to the Emergency Department and triage nurse.

## 2020-08-04 DIAGNOSIS — C3412 Malignant neoplasm of upper lobe, left bronchus or lung: Secondary | ICD-10-CM | POA: Diagnosis not present

## 2020-08-04 DIAGNOSIS — C7931 Secondary malignant neoplasm of brain: Secondary | ICD-10-CM | POA: Diagnosis not present

## 2020-08-04 DIAGNOSIS — C7951 Secondary malignant neoplasm of bone: Secondary | ICD-10-CM | POA: Diagnosis not present

## 2020-08-10 ENCOUNTER — Ambulatory Visit: Payer: Commercial Managed Care - PPO

## 2020-08-11 ENCOUNTER — Telehealth: Payer: Self-pay

## 2020-08-11 ENCOUNTER — Ambulatory Visit: Payer: Commercial Managed Care - PPO | Admitting: Oncology

## 2020-08-11 ENCOUNTER — Ambulatory Visit: Payer: Commercial Managed Care - PPO

## 2020-08-11 NOTE — Telephone Encounter (Signed)
[name] called stating that Andrew Bowen is declining.  He is having lower extremity edema that is continuing to worsen.  Unsteady gait and incontinent of bowel and bladder.  She was asking questions about Hospice services.  I did advise that Andrew Bowen would no longer be able to receive treatment if he went under Hospice service.  I gave her information to contact [facility] to see what they were able to help with.

## 2020-08-12 ENCOUNTER — Ambulatory Visit: Payer: Commercial Managed Care - PPO

## 2020-08-16 ENCOUNTER — Telehealth: Payer: Self-pay

## 2020-08-16 ENCOUNTER — Inpatient Hospital Stay: Payer: Commercial Managed Care - PPO | Admitting: Oncology

## 2020-08-16 ENCOUNTER — Inpatient Hospital Stay: Payer: Commercial Managed Care - PPO

## 2020-08-16 NOTE — Telephone Encounter (Addendum)
Michelle,Hospice nurse, calls to report that pt is requesting a call back from Dr Bobby Rumpf' nurse/office. He wants to ask a question about further CT's or MRIs. [phone]  I called pt. He wants to know if he can have a scan to see how much the chemo has worked before accepting Hospice services? [phone]  I spoke with Dr Bobby Rumpf, he doesn't order scans until pt completes 3rd cycle.  Pt notified of above. He states, "I was hoping he could do this for me". I mentioned to pt that insurance may not approve it.   I spoke to Dr Bobby Rumpf, he has agreed to order the scans and f/u.  I called Mr Trefz and notified him to expect call from our schedulers.

## 2020-08-17 ENCOUNTER — Other Ambulatory Visit: Payer: Self-pay

## 2020-08-17 ENCOUNTER — Other Ambulatory Visit: Payer: Self-pay | Admitting: Oncology

## 2020-08-17 DIAGNOSIS — C3412 Malignant neoplasm of upper lobe, left bronchus or lung: Secondary | ICD-10-CM

## 2020-08-18 ENCOUNTER — Inpatient Hospital Stay: Payer: Commercial Managed Care - PPO

## 2020-08-19 ENCOUNTER — Inpatient Hospital Stay: Payer: Commercial Managed Care - PPO

## 2020-08-20 ENCOUNTER — Inpatient Hospital Stay: Payer: Commercial Managed Care - PPO

## 2020-08-20 ENCOUNTER — Encounter: Payer: Self-pay | Admitting: Oncology

## 2020-08-23 ENCOUNTER — Other Ambulatory Visit: Payer: Self-pay | Admitting: Oncology

## 2020-08-23 NOTE — Progress Notes (Unsigned)
[facility]  [address] [place],  [place]  [address] [phone]  Clinic Day:  08/23/2020  Referring physician: No ref. provider found   HISTORY OF PRESENT ILLNESS:  The patient is a 64 y.o. 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  He comes in today to go over his CT scans to ascertain his new disease baseline after 2 cycles of carboplatin/etoposide/atezolizumab.     The patient was scheduled to receive his 2nd cycle of treatment last week, but refused to take it.   His main concern today is increased numbness in his legs and feet.  He claims something fell on his right foot for which he could not even feel it happening.  He believes taking certain medications will help his neurological issues improve over time.  He does have a baseline level of fatigue, but claims taking Decadron 4 mg twice daily has helped somewhat with this.  He remains ambivalent about continuing with additional cycles of therapy for his disease.   PHYSICAL EXAM:  There were no vitals taken for this visit. Wt Readings from Last 3 Encounters:  08/02/20 162 lb 2 oz (73.5 kg)  07/30/20 167 lb 0.8 oz (75.8 kg)  07/29/20 168 lb 0.8 oz (76.2 kg)   There is no height or weight on file to calculate BMI. Performance status (ECOG): {CHL ONC [id number] Physical Exam Constitutional:      Appearance: Normal appearance. He is not ill-appearing.  HENT:     Mouth/Throat:     Mouth: Mucous membranes are moist.     Pharynx: Oropharynx is clear. No oropharyngeal exudate or posterior oropharyngeal erythema.  Cardiovascular:     Rate and Rhythm: Normal rate and regular rhythm.     Heart sounds: No murmur heard.  No friction rub. No gallop.   Pulmonary:     Effort: Pulmonary effort is normal. No respiratory distress.     Breath sounds: Normal  breath sounds. No wheezing, rhonchi or rales.  Abdominal:     General: Bowel sounds are normal. There is no distension.     Palpations: Abdomen is soft. There is no mass.     Tenderness: There is no abdominal tenderness.  Musculoskeletal:        General: No swelling.     Right lower leg: No edema.     Left lower leg: No edema.  Lymphadenopathy:     Cervical: No cervical adenopathy.     Upper Body:     Right upper body: No supraclavicular or axillary adenopathy.     Left upper body: No supraclavicular or axillary adenopathy.     Lower Body: No right inguinal adenopathy. No left inguinal adenopathy.  Skin:    General: Skin is warm.     Coloration: Skin is not jaundiced.     Findings: No lesion or rash.  Neurological:     General: No focal deficit present.     Mental Status: He is alert and oriented to person, place, and time. Mental status is at baseline.     Cranial Nerves: Cranial nerves are intact.  Psychiatric:        Mood and Affect: Mood normal.        Behavior: Behavior normal.        Thought Content: Thought content normal.     STUDIES:  CT scans of his  chest/abdomen/pelvis revealed the following: FINDINGS: CT CHEST FINDINGS  Cardiovascular: Right IJ Port-A-Cath terminates in the high right atrium. Atherosclerotic calcification of the aorta and coronary arteries. Heart size normal. No pericardial effusion.  Mediastinum/Nodes: Mediastinal lymph nodes measure up to 7 mm in the prevascular space, unchanged. Left hilar lymph nodes measure up to 13 mm (2/30), previously 1.7 cm. No right hilar or axillary adenopathy. Esophagus is unremarkable.  Lungs/Pleura: Left perihilar mass has decreased in size in the interval, now measuring approximately 3.2 x 5.1 cm (4/50), compared to 5.2 x 6.9 cm on 05/22/2020. Associated obstruction of the left upper lobe bronchus with narrowing of the lingular bronchus. Marked improvement in postobstructive collapse/consolidation in  the posterior left upper lobe, without complete resolution. Right upper lobe pulmonary nodules measure 4 mm or less in size, unchanged. No pleural fluid. Airway is otherwise unremarkable.  Musculoskeletal: 9 mm T10 metastasis, better seen and described on MR thoracic spine 06/09/2020.  CT ABDOMEN PELVIS FINDINGS  Hepatobiliary: Heterogeneous nodules and masses throughout the liver are somewhat difficult to compare to the prior exam due to lack of IV contrast on that study. Lesions are generally similar in size with an index lesion in the left hepatic lobe measuring 4.3 x 5.0 cm (2/57), previously 4.7 x 4.9 cm. Large stone in the gallbladder. No biliary ductal dilatation.  Pancreas: Negative.  Spleen: Negative.  Adrenals/Urinary Tract: Right adrenal gland is unremarkable. 1.6 x 2.2 cm left adrenal nodule is stable. Kidneys are unremarkable. Ureters are decompressed. Difficult to exclude a small area of hyperdense nodular thickening in the left posterolateral aspect of the bladder (2/115).  Stomach/Bowel: Tiny hiatal hernia. Stomach, small bowel and appendix are otherwise unremarkable. Stool is seen throughout the colon, indicative of constipation.  Vascular/Lymphatic: Atherosclerotic calcification of the aorta without aneurysm. Retroaortic left renal vein. Porta hepatis lymph nodes measure up to 1.5 cm, similar. Abdominal and pelvic retroperitoneal lymph nodes are not enlarged by CT size criteria.  Reproductive: Prostate is visualized.  Other: Small right inguinal hernia contains fat. No free fluid. Mesenteries and peritoneum are unremarkable.  Musculoskeletal: No worrisome lytic or sclerotic lesions.  IMPRESSION: 1. Interval response to therapy as evidenced by decrease in size of a left perihilar mass and left hilar adenopathy. Hepatic metastases and probable left adrenal metastasis appear grossly stable. 2. T10 and spinal cord metastases, better seen on 06/09/2020. 3.  Nodular hyperdense thickening along the left posterolateral bladder wall, also seen and described on 05/22/2020. 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 4. Small right pulmonary nodules, stable. 5. Cholelithiasis. 6. Aortic atherosclerosis (ICD10-I70.0). Coronary artery calcification.   ASSESSMENT & PLAN:   Assessment/Plan:  A 64 y.o. male with extensive stage small cell lung cancer.  In clinic today, I went over all of his CT scan images with him, for which he could see his thoracic disease is better.  His liver disease appears slightly better, but this organ remains heavily inundated with cancer.    The patient understands all the plans discussed today and is in agreement with them.      Cailynn Bodnar Macarthur Critchley, MD

## 2020-08-23 NOTE — Progress Notes (Deleted)
  [facility]  [address] [place],  [place]  [address] [phone]  Clinic Day:  08/23/2020  Referring physician: Penelope Coop, FNP   HISTORY OF PRESENT ILLNESS:  The patient is a 64 y.o. male with ***    PHYSICAL EXAM:  There were no vitals taken for this visit. Wt Readings from Last 3 Encounters:  08/02/20 162 lb 2 oz (73.5 kg)  07/30/20 167 lb 0.8 oz (75.8 kg)  07/29/20 168 lb 0.8 oz (76.2 kg)   There is no height or weight on file to calculate BMI. Performance status (ECOG): {CHL ONC [id number] Physical Exam  LABS:   CBC Latest Ref Rng & Units 07/27/2020  WBC - 10.6  Hemoglobin 13.5 - 17.5 13.9  Hematocrit 41 - 53 42  Platelets 150 - 399 245   CMP Latest Ref Rng & Units 07/27/2020 06/18/2020 11/11/2018  Glucose 65 - 99 mg/dL - - 163(H)  BUN 4 - 21 17 - 12  Creatinine 0.6 - 1.3 0.8 0.8 0.96  Sodium 137 - 147 133(A) - 137  Potassium 3.4 - 5.3 4.5 - 4.0  Chloride 99 - 108 94(A) - 99  CO2 13 - 22 30(A) - 21  Calcium 8.7 - 10.7 9.5 - 9.5  Alkaline Phos 25 - 125 211(A) - -  AST 14 - 40 69(A) - -  ALT 10 - 40 66(A) - -     No results found for: CEA1 / No results found for: CEA1 No results found for: PSA1 No results found for: MLJ449 No results found for: EEF007  No results found for: TOTALPROTELP, ALBUMINELP, A1GS, A2GS, BETS, BETA2SER, GAMS, MSPIKE, SPEI No results found for: TIBC, FERRITIN, IRONPCTSAT No results found for: LDH  No results found for: AFPTUMOR, TOTALPROTELP, ALBUMINELP, A1GS, A2GS, BETS, BETA2SER, GAMS, MSPIKE, SPEI, LDH, CEA1, PSA1, IGASERUM, IGGSERUM, IGMSERUM, THGAB, THYROGLB   STUDIES:  No results found.    ASSESSMENT & PLAN:   Assessment/Plan:  A 64 y.o. male with *** .The patient understands all the plans discussed today and is in agreement with them.      Ania Levay Macarthur Critchley, MD

## 2020-08-24 ENCOUNTER — Inpatient Hospital Stay: Payer: Commercial Managed Care - PPO

## 2020-08-24 ENCOUNTER — Inpatient Hospital Stay: Payer: Commercial Managed Care - PPO | Attending: Oncology | Admitting: Oncology

## 2020-08-30 ENCOUNTER — Ambulatory Visit: Payer: Commercial Managed Care - PPO

## 2020-08-31 ENCOUNTER — Ambulatory Visit: Payer: Commercial Managed Care - PPO

## 2020-09-01 ENCOUNTER — Ambulatory Visit: Payer: Commercial Managed Care - PPO

## 2020-09-08 ENCOUNTER — Ambulatory Visit: Payer: Commercial Managed Care - PPO

## 2020-09-08 DEATH — deceased

## 2020-09-09 ENCOUNTER — Ambulatory Visit: Payer: Commercial Managed Care - PPO

## 2020-09-10 ENCOUNTER — Ambulatory Visit: Payer: Commercial Managed Care - PPO
